# Patient Record
Sex: Male | Born: 1965
Health system: Southern US, Community
[De-identification: ages and names within clinical notes are randomized; demographics above are authoritative.]

## PROBLEM LIST (undated history)

## (undated) DIAGNOSIS — L659 Nonscarring hair loss, unspecified: Secondary | ICD-10-CM

## (undated) DIAGNOSIS — G4700 Insomnia, unspecified: Secondary | ICD-10-CM

## (undated) DIAGNOSIS — E785 Hyperlipidemia, unspecified: Secondary | ICD-10-CM

## (undated) DIAGNOSIS — F329 Major depressive disorder, single episode, unspecified: Secondary | ICD-10-CM

## (undated) HISTORY — PX: VASECTOMY: SHX75

## (undated) HISTORY — DX: Insomnia, unspecified: G47.00

## (undated) HISTORY — DX: Nonscarring hair loss, unspecified: L65.9

## (undated) HISTORY — DX: Major depressive disorder, single episode, unspecified: F32.9

## (undated) HISTORY — DX: Hyperlipidemia, unspecified: E78.5

## (undated) HISTORY — PX: ELBOW SURGERY: SHX618

## (undated) HISTORY — PX: WRIST SURGERY: SHX841

---

## 1998-05-04 ENCOUNTER — Encounter: Payer: Self-pay | Admitting: Emergency Medicine

## 1998-05-04 ENCOUNTER — Emergency Department (HOSPITAL_COMMUNITY): Admission: EM | Admit: 1998-05-04 | Discharge: 1998-05-04 | Payer: Self-pay | Admitting: Emergency Medicine

## 2005-12-24 ENCOUNTER — Ambulatory Visit: Payer: Self-pay | Admitting: Internal Medicine

## 2005-12-27 ENCOUNTER — Ambulatory Visit: Payer: Self-pay | Admitting: Internal Medicine

## 2007-10-23 ENCOUNTER — Ambulatory Visit: Payer: Self-pay | Admitting: Internal Medicine

## 2007-10-23 LAB — CONVERTED CEMR LAB
ALT: 19 units/L (ref 0–53)
AST: 31 units/L (ref 0–37)
Albumin: 4 g/dL (ref 3.5–5.2)
Alkaline Phosphatase: 68 units/L (ref 39–117)
BUN: 14 mg/dL (ref 6–23)
Basophils Absolute: 0 10*3/uL (ref 0.0–0.1)
Basophils Relative: 0.7 % (ref 0.0–1.0)
Bilirubin, Direct: 0.1 mg/dL (ref 0.0–0.3)
CO2: 29 meq/L (ref 19–32)
Calcium: 9.3 mg/dL (ref 8.4–10.5)
Chloride: 107 meq/L (ref 96–112)
Cholesterol: 181 mg/dL (ref 0–200)
Creatinine, Ser: 0.9 mg/dL (ref 0.4–1.5)
Eosinophils Absolute: 0.1 10*3/uL (ref 0.0–0.7)
Eosinophils Relative: 2.6 % (ref 0.0–5.0)
GFR calc Af Amer: 118 mL/min
GFR calc non Af Amer: 98 mL/min
Glucose, Bld: 109 mg/dL — ABNORMAL HIGH (ref 70–99)
HCT: 44.8 % (ref 39.0–52.0)
HDL: 47.4 mg/dL (ref >39.0)
Hemoglobin, Urine: NEGATIVE
Hemoglobin: 15.2 g/dL (ref 13.0–17.0)
Ketones, ur: NEGATIVE mg/dL
LDL Cholesterol: 118 mg/dL — ABNORMAL HIGH (ref 0–99)
Leukocytes, UA: NEGATIVE
Lymphocytes Relative: 23.8 % (ref 12.0–46.0)
MCHC: 33.8 g/dL (ref 30.0–36.0)
MCV: 92.8 fL (ref 78.0–100.0)
Monocytes Absolute: 0.5 10*3/uL (ref 0.1–1.0)
Monocytes Relative: 10.6 % (ref 3.0–12.0)
Neutro Abs: 3.3 10*3/uL (ref 1.4–7.7)
Neutrophils Relative %: 62.3 % (ref 43.0–77.0)
Nitrite: NEGATIVE
PSA: 0.38 ng/mL (ref 0.10–4.00)
Platelets: 295 10*3/uL (ref 150–400)
Potassium: 4.2 meq/L (ref 3.5–5.1)
RBC: 4.83 M/uL (ref 4.22–5.81)
RDW: 11.7 % (ref 11.5–14.6)
Sodium: 142 meq/L (ref 135–145)
Specific Gravity, Urine: >=1.03 (ref 1.000–1.03)
TSH: 0.66 microintl units/mL (ref 0.35–5.50)
Total Bilirubin: 0.7 mg/dL (ref 0.3–1.2)
Total CHOL/HDL Ratio: 3.8
Total Protein: 6.7 g/dL (ref 6.0–8.3)
Triglycerides: 80 mg/dL (ref 0–149)
Urine Glucose: NEGATIVE mg/dL
Urobilinogen, UA: 0.2 (ref 0.0–1.0)
VLDL: 16 mg/dL (ref 0–40)
WBC: 5.1 10*3/uL (ref 4.5–10.5)
pH: 6 (ref 5.0–8.0)

## 2007-10-26 ENCOUNTER — Ambulatory Visit: Payer: Self-pay | Admitting: Internal Medicine

## 2007-10-26 DIAGNOSIS — E785 Hyperlipidemia, unspecified: Secondary | ICD-10-CM | POA: Insufficient documentation

## 2007-10-26 HISTORY — DX: Hyperlipidemia, unspecified: E78.5

## 2007-12-05 ENCOUNTER — Telehealth (INDEPENDENT_AMBULATORY_CARE_PROVIDER_SITE_OTHER): Payer: Self-pay | Admitting: *Deleted

## 2007-12-05 DIAGNOSIS — F329 Major depressive disorder, single episode, unspecified: Secondary | ICD-10-CM

## 2007-12-05 DIAGNOSIS — F3289 Other specified depressive episodes: Secondary | ICD-10-CM

## 2007-12-05 HISTORY — DX: Other specified depressive episodes: F32.89

## 2007-12-05 HISTORY — DX: Major depressive disorder, single episode, unspecified: F32.9

## 2007-12-11 ENCOUNTER — Ambulatory Visit: Payer: Self-pay | Admitting: Professional

## 2010-03-19 DIAGNOSIS — J309 Allergic rhinitis, unspecified: Secondary | ICD-10-CM | POA: Insufficient documentation

## 2010-06-18 ENCOUNTER — Ambulatory Visit: Payer: Self-pay | Admitting: Internal Medicine

## 2010-06-18 ENCOUNTER — Encounter: Payer: Self-pay | Admitting: Internal Medicine

## 2010-06-18 DIAGNOSIS — G4700 Insomnia, unspecified: Secondary | ICD-10-CM

## 2010-06-18 HISTORY — DX: Insomnia, unspecified: G47.00

## 2010-06-19 LAB — CONVERTED CEMR LAB
ALT: 23 units/L (ref 0–53)
AST: 38 units/L — ABNORMAL HIGH (ref 0–37)
Albumin: 4.3 g/dL (ref 3.5–5.2)
Alkaline Phosphatase: 61 units/L (ref 39–117)
BUN: 20 mg/dL (ref 6–23)
Basophils Absolute: 0.1 10*3/uL (ref 0.0–0.1)
Basophils Relative: 1 % (ref 0.0–3.0)
Bilirubin Urine: NEGATIVE
Bilirubin, Direct: 0.1 mg/dL (ref 0.0–0.3)
Blood, UA: NEGATIVE
CO2: 26 meq/L (ref 19–32)
Calcium: 9.5 mg/dL (ref 8.4–10.5)
Chloride: 105 meq/L (ref 96–112)
Cholesterol: 246 mg/dL — ABNORMAL HIGH (ref 0–200)
Creatinine, Ser: 1 mg/dL (ref 0.4–1.5)
Direct LDL: 176.6 mg/dL
Eosinophils Absolute: 0.2 10*3/uL (ref 0.0–0.7)
Eosinophils Relative: 2.1 % (ref 0.0–5.0)
GFR calc non Af Amer: 87.47 mL/min (ref >60.00)
Glucose, Bld: 85 mg/dL (ref 70–99)
HCT: 46 % (ref 39.0–52.0)
HDL: 62.5 mg/dL (ref >39.00)
Hemoglobin: 16 g/dL (ref 13.0–17.0)
Ketones, ur: NEGATIVE mg/dL
Leukocytes, UA: NEGATIVE
Lymphocytes Relative: 21.2 % (ref 12.0–46.0)
Lymphs Abs: 1.7 10*3/uL (ref 0.7–4.0)
MCHC: 34.8 g/dL (ref 30.0–36.0)
MCV: 91.9 fL (ref 78.0–100.0)
Monocytes Absolute: 0.6 10*3/uL (ref 0.1–1.0)
Monocytes Relative: 7.7 % (ref 3.0–12.0)
Neutro Abs: 5.3 10*3/uL (ref 1.4–7.7)
Neutrophils Relative %: 68 % (ref 43.0–77.0)
Nitrite: NEGATIVE
PSA: 1.07 ng/mL (ref 0.10–4.00)
Platelets: 294 10*3/uL (ref 150.0–400.0)
Potassium: 4.5 meq/L (ref 3.5–5.1)
RBC: 5.01 M/uL (ref 4.22–5.81)
RDW: 12.5 % (ref 11.5–14.6)
Sodium: 139 meq/L (ref 135–145)
Specific Gravity, Urine: 1.025 (ref 1.000–1.030)
TSH: 1.02 microintl units/mL (ref 0.35–5.50)
Total Bilirubin: 0.7 mg/dL (ref 0.3–1.2)
Total CHOL/HDL Ratio: 4
Total Protein, Urine: NEGATIVE mg/dL
Total Protein: 7.4 g/dL (ref 6.0–8.3)
Triglycerides: 61 mg/dL (ref 0.0–149.0)
Urine Glucose: NEGATIVE mg/dL
Urobilinogen, UA: 0.2 (ref 0.0–1.0)
VLDL: 12.2 mg/dL (ref 0.0–40.0)
WBC: 7.9 10*3/uL (ref 4.5–10.5)
pH: 6 (ref 5.0–8.0)

## 2010-07-30 NOTE — Assessment & Plan Note (Signed)
Summary: OV---STC   Vital Signs:  Patient profile:   45 year old male Height:      70 inches Weight:      195 pounds BMI:     28.08 O2 Sat:      95 % on Room air Temp:     97.9 degrees F oral Pulse rate:   72 / minute BP sitting:   130 / 86  (left arm) Cuff size:   regular  Vitals Entered By: Zella Ball Ewing CMA (AAMA) (June 18, 2010 8:20 AM)  O2 Flow:  Room air  CC: physical/RE   CC:  physical/RE.  History of Present Illness: here for wellness, overall doing ok, Pt denies CP, worsening sob, doe, wheezing, orthopnea, pnd, worsening LE edema, palps, dizziness or syncope  Pt denies new neuro symptoms such as headache, facial or extremity weakness  Pt denies polydipsia, polyuria   Overall good compliance with meds, trying to follow low chol diet, wt stable, little excercise however No fever, wt loss, night sweats, loss of appetite or other constitutional symptoms  Overall good compliance with meds, and good tolerability. Denies worsening depressive symptoms, suicidal ideation, or panic.   Pt states good ability with ADL's, low fall risk, home safety reviewed and adequate, no significant change in hearing or vision, trying to follow lower chol diet, and occasionally active only with regular excercise.  Does have signficant sleep many nights, tends to wake up 1am and cant get back to sleep for 1-2 hrs.    Preventive Screening-Counseling & Management      Drug Use:  no.    Problems Prior to Update: 1)  Insomnia-sleep Disorder-unspec  (ICD-780.52) 2)  Depressive Disorder Not Elsewhere Classified  (ICD-311) 3)  Hyperlipidemia  (ICD-272.4) 4)  Preventive Health Care  (ICD-V70.0)  Medications Prior to Update: 1)  Finasteride 5 Mg  Tabs (Finasteride) .Marland Kitchen.. 1 By Mouth Qd  Current Medications (verified): 1)  Finasteride 5 Mg  Tabs (Finasteride) .Marland Kitchen.. 1 By Mouth Once Daily 2)  Zolpidem Tartrate 10 Mg Tabs (Zolpidem Tartrate) .Marland Kitchen.. 1 By Mouth At Bedtime  As Needed  Allergies (verified): No  Known Drug Allergies  Past History:  Past Medical History: Last updated: 10/26/2007 Hyperlipidemia alopecia - male pattern  Past Surgical History: Last updated: 10/26/2007 Vasectomy  Family History: Last updated: 10/26/2007 heart disease - father cousin with melanoma grandmother with DM  Social History: Last updated: 06/18/2010 Married no children work - Product manager Never Smoked Alcohol use-yes Drug use-no  Risk Factors: Alcohol Use: 2 (10/26/2007)  Risk Factors: Smoking Status: never (10/26/2007)  Social History: Married no children work - Product manager Never Smoked Alcohol use-yes Drug use-no Drug Use:  no  Review of Systems  The patient denies anorexia, fever, vision loss, decreased hearing, hoarseness, chest pain, syncope, dyspnea on exertion, peripheral edema, prolonged cough, headaches, hemoptysis, abdominal pain, melena, hematochezia, severe indigestion/heartburn, hematuria, muscle weakness, suspicious skin lesions, transient blindness, difficulty walking, depression, unusual weight change, abnormal bleeding, enlarged lymph nodes, and angioedema.         all otherwise negative per pt -    Physical Exam  General:  Well-developed,well-nourished,in no acute distress; alert,appropriate and cooperative throughout examination Head:  Normocephalic and atraumatic without obvious abnormalities. No apparent alopecia or balding. Eyes:  No corneal or conjunctival inflammation noted. EOMI. Perrla. Ears:  R ear normal and L ear normal.   Nose:  no external deformity and no nasal discharge.   Mouth:  no gingival abnormalities and pharynx pink and  moist.   Neck:  supple and no masses.   Lungs:  Normal respiratory effort, chest expands symmetrically. Lungs are clear to auscultation, no crackles or wheezes. Heart:  Normal rate and regular rhythm. S1 and S2 normal without gallop, murmur, click, rub or other extra sounds. Abdomen:  Bowel sounds  positive,abdomen soft and non-tender without masses, organomegaly or hernias noted. Msk:  No deformity or scoliosis noted of thoracic or lumbar spine.   Extremities:  No clubbing, cyanosis, edema, or deformity noted with normal full range of motion of all joints.   Neurologic:  No cranial nerve deficits noted. Station and gait are normal. Plantar reflexes are down-going bilaterally. DTRs are symmetrical throughout. Sensory, motor and coordinative functions appear intact. Skin:  color normal and no rashes.   Psych:  not depressed appearing and slightly anxious.     Impression & Recommendations:  Problem # 1:  Preventive Health Care (ICD-V70.0) Overall doing well, age appropriate education and counseling updated, referral for preventive services and immunizations addressed, dietary counseling and smoking status adressed , most recent labs reviewed, ecg reviewed I have personally reviewed and have noted 1.The patient's medical and social history 2.Their use of alcohol, tobacco or illicit drugs 3.Their current medications and supplements 4. Functional ability including ADL's, fall risk, home safety risk, hearing & visual impairment  5.Diet and physical activities 6.Evidence for depression or mood disorders The patients weight, height, BMI  have been recorded in the chart I have made referrals, counseling and provided education to the patient based review of the above  Orders: EKG w/ Interpretation (93000) TLB-BMP (Basic Metabolic Panel-BMET) (80048-METABOL) TLB-CBC Platelet - w/Differential (85025-CBCD) TLB-Hepatic/Liver Function Pnl (80076-HEPATIC) TLB-Lipid Panel (80061-LIPID) TLB-PSA (Prostate Specific Antigen) (84153-PSA) TLB-TSH (Thyroid Stimulating Hormone) (84443-TSH) TLB-Udip ONLY (81003-UDIP)  Problem # 2:  INSOMNIA-SLEEP DISORDER-UNSPEC (ICD-780.52)  His updated medication list for this problem includes:    Zolpidem Tartrate 10 Mg Tabs (Zolpidem tartrate) .Marland Kitchen... 1 by mouth at  bedtime  as needed treat as above, f/u any worsening signs or symptoms   Complete Medication List: 1)  Finasteride 5 Mg Tabs (Finasteride) .Marland Kitchen.. 1 by mouth once daily 2)  Zolpidem Tartrate 10 Mg Tabs (Zolpidem tartrate) .Marland Kitchen.. 1 by mouth at bedtime  as needed  Patient Instructions: 1)  Your EKG was good today 2)  Please go to the Lab in the basement for your blood and/or urine tests today 3)  Please call the number on the [facility] for results of your testing 4)  Please take all new medications as prescribed 5)  Continue all previous medications as before this visit  6)  Please schedule a follow-up appointment in 1 year, or sooner if needed Prescriptions: ZOLPIDEM TARTRATE 10 MG TABS (ZOLPIDEM TARTRATE) 1 by mouth at bedtime  as needed  #90 x 1   Entered and Authorized by:   Corwin Levins MD   Signed by:   Corwin Levins MD on 06/18/2010   Method used:   Print then Give to Patient   RxID:   [id number] FINASTERIDE 5 MG  TABS (FINASTERIDE) 1 by mouth once daily  #90 x 3   Entered and Authorized by:   Corwin Levins MD   Signed by:   Corwin Levins MD on 06/18/2010   Method used:   Print then Give to Patient   RxID:   [id number]    Orders Added: 1)  EKG w/ Interpretation [93000] 2)  TLB-BMP (Basic Metabolic Panel-BMET) [80048-METABOL] 3)  TLB-CBC Platelet -  w/Differential [85025-CBCD] 4)  TLB-Hepatic/Liver Function Pnl [80076-HEPATIC] 5)  TLB-Lipid Panel [80061-LIPID] 6)  TLB-PSA (Prostate Specific Antigen) [84153-PSA] 7)  TLB-TSH (Thyroid Stimulating Hormone) [84443-TSH] 8)  TLB-Udip ONLY [81003-UDIP] 9)  Est. Patient 40-64 years [54098]

## 2012-03-29 ENCOUNTER — Telehealth: Payer: Self-pay

## 2012-03-29 DIAGNOSIS — Z0000 Encounter for general adult medical examination without abnormal findings: Secondary | ICD-10-CM

## 2012-03-29 NOTE — Telephone Encounter (Signed)
Lab order entered.

## 2012-05-09 ENCOUNTER — Other Ambulatory Visit (INDEPENDENT_AMBULATORY_CARE_PROVIDER_SITE_OTHER): Payer: Self-pay

## 2012-05-09 DIAGNOSIS — Z0000 Encounter for general adult medical examination without abnormal findings: Secondary | ICD-10-CM

## 2012-05-09 LAB — URINALYSIS, ROUTINE W REFLEX MICROSCOPIC
Ketones, ur: NEGATIVE
Total Protein, Urine: NEGATIVE
Urobilinogen, UA: 0.2 (ref 0.0–1.0)
pH: 5.5 (ref 5.0–8.0)

## 2012-05-09 LAB — HEPATIC FUNCTION PANEL
Bilirubin, Direct: 0.1 mg/dL (ref 0.0–0.3)
Total Bilirubin: 0.9 mg/dL (ref 0.3–1.2)
Total Protein: 7.4 g/dL (ref 6.0–8.3)

## 2012-05-09 LAB — BASIC METABOLIC PANEL
BUN: 19 mg/dL (ref 6–23)
Calcium: 9.5 mg/dL (ref 8.4–10.5)
Creatinine, Ser: 0.9 mg/dL (ref 0.4–1.5)

## 2012-05-09 LAB — CBC WITH DIFFERENTIAL/PLATELET
Basophils Absolute: 0 10*3/uL (ref 0.0–0.1)
Basophils Relative: 0.5 % (ref 0.0–3.0)
Eosinophils Absolute: 0.1 10*3/uL (ref 0.0–0.7)
HCT: 45.4 % (ref 39.0–52.0)
Hemoglobin: 15.1 g/dL (ref 13.0–17.0)
Lymphs Abs: 1.7 10*3/uL (ref 0.7–4.0)
MCHC: 33.2 g/dL (ref 30.0–36.0)
MCV: 92.2 fl (ref 78.0–100.0)
Neutro Abs: 3.2 10*3/uL (ref 1.4–7.7)
RDW: 12.8 % (ref 11.5–14.6)

## 2012-05-09 LAB — TSH: TSH: 0.82 u[IU]/mL (ref 0.35–5.50)

## 2012-05-09 LAB — LIPID PANEL
Cholesterol: 237 mg/dL — ABNORMAL HIGH (ref 0–200)
Total CHOL/HDL Ratio: 4
Triglycerides: 53 mg/dL (ref 0.0–149.0)
VLDL: 10.6 mg/dL (ref 0.0–40.0)

## 2012-05-15 ENCOUNTER — Ambulatory Visit (INDEPENDENT_AMBULATORY_CARE_PROVIDER_SITE_OTHER): Payer: BC Managed Care – PPO | Admitting: Internal Medicine

## 2012-05-15 ENCOUNTER — Encounter: Payer: Self-pay | Admitting: Internal Medicine

## 2012-05-15 VITALS — BP 110/78 | HR 53 | Temp 96.6°F | Ht 70.0 in | Wt 204.2 lb

## 2012-05-15 DIAGNOSIS — Z23 Encounter for immunization: Secondary | ICD-10-CM

## 2012-05-15 DIAGNOSIS — G4700 Insomnia, unspecified: Secondary | ICD-10-CM

## 2012-05-15 DIAGNOSIS — Z0000 Encounter for general adult medical examination without abnormal findings: Secondary | ICD-10-CM | POA: Insufficient documentation

## 2012-05-15 DIAGNOSIS — L659 Nonscarring hair loss, unspecified: Secondary | ICD-10-CM

## 2012-05-15 DIAGNOSIS — E785 Hyperlipidemia, unspecified: Secondary | ICD-10-CM

## 2012-05-15 HISTORY — DX: Nonscarring hair loss, unspecified: L65.9

## 2012-05-15 MED ORDER — ZOLPIDEM TARTRATE 10 MG PO TABS: 10.0000 mg | ORAL_TABLET | Freq: Every evening | ORAL | Status: DC | PRN

## 2012-05-15 MED ORDER — FINASTERIDE 5 MG PO TABS: ORAL_TABLET | ORAL | Status: DC

## 2012-05-15 MED ORDER — ASPIRIN 81 MG PO TBEC: 81.0000 mg | DELAYED_RELEASE_TABLET | Freq: Every day | ORAL | Status: AC

## 2012-05-15 NOTE — Progress Notes (Signed)
Subjective:    Patient ID: Travis Kane, male    DOB: 05-26-1966, 46 y.o.   MRN: [id number]  HPI  Here for wellness and f/u;  Overall doing ok;  Pt denies CP, worsening SOB, DOE, wheezing, orthopnea, PND, worsening LE edema, palpitations, dizziness or syncope.  Pt denies neurological change such as new Headache, facial or extremity weakness.  Pt denies polydipsia, polyuria, or low sugar symptoms. Pt states overall good compliance with treatment and medications, good tolerability, and trying to follow lower cholesterol diet.  Pt denies worsening depressive symptoms, suicidal ideation or panic. No fever, wt loss, night sweats, loss of appetite, or other constitutional symptoms.  Pt states good ability with ADL's, low fall risk, home safety reviewed and adequate, no significant changes in hearing or vision, and occasionally active with exercise.  Works as Marine scientist, working for himself now. Past Medical History  Diagnosis Date  . Alopecia 05/15/2012  . INSOMNIA-SLEEP DISORDER-UNSPEC 06/18/2010    Qualifier: Diagnosis of  By: Jonny Ruiz MD, Len Blalock   . HYPERLIPIDEMIA 10/26/2007    Qualifier: Diagnosis of  By: Jonny Ruiz MD, Len Blalock   . DEPRESSIVE DISORDER NOT ELSEWHERE CLASSIFIED 12/05/2007    Qualifier: Diagnosis of  By: Jonny Ruiz MD, Len Blalock    Past Surgical History  Procedure Date  . Vasectomy     reports that he has never smoked. He has never used smokeless tobacco. He reports that he drinks alcohol. He reports that he does not use illicit drugs. family history includes Diabetes in his maternal grandmother and Heart disease in his father. No Known Allergies Current Outpatient Prescriptions on File Prior to Visit  Medication Sig Dispense Refill  . zolpidem (AMBIEN) 10 MG tablet Take 1 tablet (10 mg total) by mouth at bedtime as needed for sleep.  30 tablet  5   Review of Systems Review of Systems  Constitutional: Negative for diaphoresis, activity change, appetite change and  unexpected weight change.  HENT: Negative for hearing loss, ear pain, facial swelling, mouth sores and neck stiffness.   Eyes: Negative for pain, redness and visual disturbance.  Respiratory: Negative for shortness of breath and wheezing.   Cardiovascular: Negative for chest pain and palpitations.  Gastrointestinal: Negative for diarrhea, blood in stool, abdominal distention and rectal pain.  Genitourinary: Negative for hematuria, flank pain and decreased urine volume.  Musculoskeletal: Negative for myalgias and joint swelling.  Skin: Negative for color change and wound.  Neurological: Negative for syncope and numbness.  Hematological: Negative for adenopathy.  Psychiatric/Behavioral: Negative for hallucinations, self-injury, decreased concentration and agitation.      Objective:   Physical Exam BP 110/78  Pulse 53  Temp 96.6 F (35.9 C) (Oral)  Ht 5\' 10"  (1.778 m)  Wt 204 lb 4 oz (92.647 kg)  BMI 29.31 kg/m2  SpO2 94% Physical Exam  VS noted Constitutional: Pt is oriented to person, place, and time. Appears well-developed and well-nourished.  HENT:  Head: Normocephalic and atraumatic.  Right Ear: External ear normal.  Left Ear: External ear normal.  Nose: Nose normal.  Mouth/Throat: Oropharynx is clear and moist.  Eyes: Conjunctivae and EOM are normal. Pupils are equal, round, and reactive to light.  Neck: Normal range of motion. Neck supple. No JVD present. No tracheal deviation present.  Cardiovascular: Normal rate, regular rhythm, normal heart sounds and intact distal pulses.   Pulmonary/Chest: Effort normal and breath sounds normal.  Abdominal: Soft. Bowel sounds are normal. There is no tenderness.  Musculoskeletal: Normal range of  motion. Exhibits no edema.  Lymphadenopathy:  Has no cervical adenopathy.  Neurological: Pt is alert and oriented to person, place, and time. Pt has normal reflexes. No cranial nerve deficit.  Skin: Skin is warm and dry. No rash noted.    Psychiatric:  Has  normal mood and affect. Behavior is normal.     Assessment & Plan:

## 2012-05-15 NOTE — Patient Instructions (Addendum)
You had the tetanus shot today Your finasteride was refilled today Take all new medications as prescribed - the generic ambien as needed Please also start Aspirin at 81 mg per day - COATED only Please continue your efforts at being more active, low cholesterol diet, and weight control. Please call if you would like to start Lipitor at 10 mg per day Please return in 6 mo with Lab testing Only Thank you for enrolling in MyChart. Please follow the instructions below to securely access your online medical record. MyChart allows you to send messages to your doctor, view your test results, renew your prescriptions, schedule appointments, and more.  To Log into MyChart, please go to [email or web address], and your Username is: [email or web address] Please return in 1 year for your yearly visit, or sooner if needed, with Lab testing done 3-5 days before

## 2012-05-15 NOTE — Assessment & Plan Note (Signed)
To cont finasteride

## 2012-05-15 NOTE — Assessment & Plan Note (Signed)
D/w  Pt, declines statin for now, to f/u lipids at 6 mo

## 2012-05-15 NOTE — Assessment & Plan Note (Signed)
Continue all other medications as before  

## 2012-05-15 NOTE — Assessment & Plan Note (Signed)

## 2012-08-12 ENCOUNTER — Other Ambulatory Visit: Payer: Self-pay

## 2013-05-03 ENCOUNTER — Other Ambulatory Visit: Payer: Self-pay

## 2013-05-30 ENCOUNTER — Encounter: Payer: BC Managed Care – PPO | Admitting: Internal Medicine

## 2013-05-30 DIAGNOSIS — Z0289 Encounter for other administrative examinations: Secondary | ICD-10-CM

## 2014-05-28 ENCOUNTER — Telehealth: Payer: Self-pay

## 2014-05-28 DIAGNOSIS — Z0000 Encounter for general adult medical examination without abnormal findings: Secondary | ICD-10-CM

## 2014-05-28 NOTE — Telephone Encounter (Signed)
Labs entered.

## 2014-05-29 ENCOUNTER — Encounter: Payer: Self-pay | Admitting: Internal Medicine

## 2014-05-29 ENCOUNTER — Ambulatory Visit (INDEPENDENT_AMBULATORY_CARE_PROVIDER_SITE_OTHER): Payer: BC Managed Care – PPO | Admitting: Internal Medicine

## 2014-05-29 ENCOUNTER — Other Ambulatory Visit (INDEPENDENT_AMBULATORY_CARE_PROVIDER_SITE_OTHER): Payer: BC Managed Care – PPO

## 2014-05-29 ENCOUNTER — Other Ambulatory Visit: Payer: Self-pay | Admitting: Internal Medicine

## 2014-05-29 VITALS — BP 126/84 | HR 80 | Temp 97.8°F | Ht 70.0 in | Wt 208.0 lb

## 2014-05-29 DIAGNOSIS — G4700 Insomnia, unspecified: Secondary | ICD-10-CM

## 2014-05-29 DIAGNOSIS — E785 Hyperlipidemia, unspecified: Secondary | ICD-10-CM

## 2014-05-29 DIAGNOSIS — Z23 Encounter for immunization: Secondary | ICD-10-CM

## 2014-05-29 DIAGNOSIS — Z0000 Encounter for general adult medical examination without abnormal findings: Secondary | ICD-10-CM

## 2014-05-29 LAB — HEPATIC FUNCTION PANEL
ALT: 22 U/L (ref 0–53)
AST: 32 U/L (ref 0–37)
Albumin: 4.1 g/dL (ref 3.5–5.2)
Alkaline Phosphatase: 71 U/L (ref 39–117)
Bilirubin, Direct: 0.1 mg/dL (ref 0.0–0.3)
TOTAL PROTEIN: 7.1 g/dL (ref 6.0–8.3)
Total Bilirubin: 0.8 mg/dL (ref 0.2–1.2)

## 2014-05-29 LAB — CBC WITH DIFFERENTIAL/PLATELET
BASOS ABS: 0.1 10*3/uL (ref 0.0–0.1)
Basophils Relative: 1.4 % (ref 0.0–3.0)
Eosinophils Absolute: 0.4 10*3/uL (ref 0.0–0.7)
Eosinophils Relative: 6 % — ABNORMAL HIGH (ref 0.0–5.0)
HCT: 46.5 % (ref 39.0–52.0)
Hemoglobin: 15.3 g/dL (ref 13.0–17.0)
Lymphocytes Relative: 34.9 % (ref 12.0–46.0)
Lymphs Abs: 2.1 10*3/uL (ref 0.7–4.0)
MCHC: 33 g/dL (ref 30.0–36.0)
MCV: 91.2 fl (ref 78.0–100.0)
MONOS PCT: 9 % (ref 3.0–12.0)
Monocytes Absolute: 0.5 10*3/uL (ref 0.1–1.0)
NEUTROS PCT: 48.7 % (ref 43.0–77.0)
Neutro Abs: 2.9 10*3/uL (ref 1.4–7.7)
PLATELETS: 303 10*3/uL (ref 150.0–400.0)
RBC: 5.09 Mil/uL (ref 4.22–5.81)
RDW: 12.4 % (ref 11.5–15.5)
WBC: 5.9 10*3/uL (ref 4.0–10.5)

## 2014-05-29 LAB — LIPID PANEL
CHOLESTEROL: 262 mg/dL — AB (ref 0–200)
HDL: 42.9 mg/dL (ref >39.00)
LDL CALC: 198 mg/dL — AB (ref 0–99)
NonHDL: 219.1
Total CHOL/HDL Ratio: 6
Triglycerides: 107 mg/dL (ref 0.0–149.0)
VLDL: 21.4 mg/dL (ref 0.0–40.0)

## 2014-05-29 LAB — TSH: TSH: 1.49 u[IU]/mL (ref 0.35–4.50)

## 2014-05-29 LAB — URINALYSIS, ROUTINE W REFLEX MICROSCOPIC
Bilirubin Urine: NEGATIVE
HGB URINE DIPSTICK: NEGATIVE
Ketones, ur: NEGATIVE
Leukocytes, UA: NEGATIVE
NITRITE: NEGATIVE
RBC / HPF: NONE SEEN (ref 0–?)
SPECIFIC GRAVITY, URINE: 1.025 (ref 1.000–1.030)
TOTAL PROTEIN, URINE-UPE24: NEGATIVE
UROBILINOGEN UA: 0.2 (ref 0.0–1.0)
Urine Glucose: NEGATIVE
pH: 6 (ref 5.0–8.0)

## 2014-05-29 LAB — BASIC METABOLIC PANEL
BUN: 18 mg/dL (ref 6–23)
CALCIUM: 9.3 mg/dL (ref 8.4–10.5)
CO2: 26 mEq/L (ref 19–32)
Chloride: 104 mEq/L (ref 96–112)
Creatinine, Ser: 1 mg/dL (ref 0.4–1.5)
GFR: 87.05 mL/min (ref >60.00)
Glucose, Bld: 91 mg/dL (ref 70–99)
Potassium: 4.8 mEq/L (ref 3.5–5.1)
SODIUM: 137 meq/L (ref 135–145)

## 2014-05-29 LAB — PSA: PSA: 1.18 ng/mL (ref 0.10–4.00)

## 2014-05-29 MED ORDER — FINASTERIDE 5 MG PO TABS: ORAL_TABLET | ORAL | Status: AC

## 2014-05-29 MED ORDER — ATORVASTATIN CALCIUM 20 MG PO TABS: 20.0000 mg | ORAL_TABLET | Freq: Every day | ORAL | Status: AC

## 2014-05-29 MED ORDER — ZOLPIDEM TARTRATE 10 MG PO TABS: 10.0000 mg | ORAL_TABLET | Freq: Every evening | ORAL | Status: AC | PRN

## 2014-05-29 NOTE — Assessment & Plan Note (Signed)
Stable, cont ambien prn

## 2014-05-29 NOTE — Addendum Note (Signed)
Addended by: Biagio Borg on: 05/29/2014 10:48 AM   Modules accepted: Miquel Dunn

## 2014-05-29 NOTE — Progress Notes (Signed)
Subjective:    Patient ID: Travis Kane, male    DOB: 1965/09/10, 48 y.o.   MRN: [id number]  HPI Here for wellness and f/u;  Overall doing ok;  Pt denies CP, worsening SOB, DOE, wheezing, orthopnea, PND, worsening LE edema, palpitations, dizziness or syncope.  Pt denies neurological change such as new headache, facial or extremity weakness.  Pt denies polydipsia, polyuria, or low sugar symptoms. Pt states overall good compliance with treatment and medications, good tolerability, and has been trying to follow lower cholesterol diet.  Pt denies worsening depressive symptoms, suicidal ideation or panic. No fever, night sweats, wt loss, loss of appetite, or other constitutional symptoms.  Pt states good ability with ADL's, has low fall risk, home safety reviewed and adequate, no other significant changes in hearing or vision, and only occasionally active with exercise. Past Medical History  Diagnosis Date  . Alopecia 05/15/2012  . INSOMNIA-SLEEP DISORDER-UNSPEC 06/18/2010    Qualifier: Diagnosis of  By: Jenny Reichmann MD, Hunt Oris   . HYPERLIPIDEMIA 10/26/2007    Qualifier: Diagnosis of  By: Jenny Reichmann MD, Hunt Oris   . DEPRESSIVE DISORDER NOT ELSEWHERE CLASSIFIED 12/05/2007    Qualifier: Diagnosis of  By: Jenny Reichmann MD, Hunt Oris    Past Surgical History  Procedure Laterality Date  . Vasectomy      reports that he has never smoked. He has never used smokeless tobacco. He reports that he drinks alcohol. He reports that he does not use illicit drugs. family history includes Diabetes in his maternal grandmother; Heart attack in his brother; Heart disease in his father. No Known Allergies Current Outpatient Prescriptions on File Prior to Visit  Medication Sig Dispense Refill  . aspirin 81 MG EC tablet Take 1 tablet (81 mg total) by mouth daily. Swallow whole. 30 tablet 12  . finasteride (PROSCAR) 5 MG tablet 1/2 tab by mouth per day 45 tablet 3  . zolpidem (AMBIEN) 10 MG tablet Take 1 tablet (10 mg total) by mouth at  bedtime as needed for sleep. 30 tablet 5   No current facility-administered medications on file prior to visit.    44yo brother died with MI. Pt now back to taking asa 81 mg after this, and willing to start statin if LDL elevated.    Review of Systems Constitutional: Negative for increased diaphoresis, other activity, appetite or other siginficant weight change  HENT: Negative for worsening hearing loss, ear pain, facial swelling, mouth sores and neck stiffness.   Eyes: Negative for other worsening pain, redness or visual disturbance.  Respiratory: Negative for shortness of breath and wheezing.   Cardiovascular: Negative for chest pain and palpitations.  Gastrointestinal: Negative for diarrhea, blood in stool, abdominal distention or other pain Genitourinary: Negative for hematuria, flank pain or change in urine volume.  Musculoskeletal: Negative for myalgias or other joint complaints.  Skin: Negative for color change and wound.  Neurological: Negative for syncope and numbness. other than noted Hematological: Negative for adenopathy. or other swelling Psychiatric/Behavioral: Negative for hallucinations, self-injury, decreased concentration or other worsening agitation.      Objective:   Physical Exam BP 126/84 mmHg  Pulse 80  Temp(Src) 97.8 F (36.6 C) (Oral)  Ht 5\' 10"  (1.778 m)  Wt 208 lb (94.348 kg)  BMI 29.84 kg/m2  SpO2 97% VS noted,  Constitutional: Pt is oriented to person, place, and time. Appears well-developed and well-nourished.  Head: Normocephalic and atraumatic.  Right Ear: External ear normal.  Left Ear: External ear normal.  Nose: Nose  normal.  Mouth/Throat: Oropharynx is clear and moist.  Eyes: Conjunctivae and EOM are normal. Pupils are equal, round, and reactive to light.  Neck: Normal range of motion. Neck supple. No JVD present. No tracheal deviation present.  Cardiovascular: Normal rate, regular rhythm, normal heart sounds and intact distal pulses.     Pulmonary/Chest: Effort normal and breath sounds without rales or wheezing  Abdominal: Soft. Bowel sounds are normal. NT. No HSM  Musculoskeletal: Normal range of motion. Exhibits no edema.  Lymphadenopathy:  Has no cervical adenopathy.  Neurological: Pt is alert and oriented to person, place, and time. Pt has normal reflexes. No cranial nerve deficit. Motor grossly intact Skin: Skin is warm and dry. No rash noted.  Psychiatric:  Has normal mood and affect. Behavior is normal.     Assessment & Plan:

## 2014-05-29 NOTE — Addendum Note (Signed)
Addended by: Sharon Seller B on: 05/29/2014 10:40 AM   Modules accepted: Orders

## 2014-05-29 NOTE — Patient Instructions (Addendum)
You had the flu shot today  Your EKG was OK today  Please re-start the Aspirin 81 mg per day (enteric coated only)  You will be contacted regarding the referral for: colonoscopy  Your blood work was done and pendfing from this AM.   Please continue all other medications as before, and refills have been done if requested.  Please have the pharmacy call with any other refills you may need.  Please continue your efforts at being more active, low cholesterol diet, and weight control.  You are otherwise up to date with prevention measures today.  Please keep your appointments with your specialists as you may have planned  You will be contacted by phone if any changes need to be made immediately.  Otherwise, you will receive a letter about your results with an explanation, but please check with MyChart first.  Please remember to sign up for MyChart if you have not done so, as this will be important to you in the future with finding out test results, communicating by private email, and scheduling acute appointments online when needed.  Please return in 1 year for your yearly visit, or sooner if needed, with Lab testing done 3-5 days before

## 2014-05-29 NOTE — Progress Notes (Signed)
Pre visit review using our clinic review tool, if applicable. No additional management support is needed unless otherwise documented below in the visit note. 

## 2014-05-29 NOTE — Assessment & Plan Note (Addendum)

## 2014-05-29 NOTE — Assessment & Plan Note (Signed)
lkely needs statin due to [place], will check LDL

## 2015-09-17 DIAGNOSIS — D126 Benign neoplasm of colon, unspecified: Secondary | ICD-10-CM | POA: Insufficient documentation

## 2016-05-14 DIAGNOSIS — E669 Obesity, unspecified: Secondary | ICD-10-CM | POA: Insufficient documentation

## 2016-05-14 DIAGNOSIS — N529 Male erectile dysfunction, unspecified: Secondary | ICD-10-CM | POA: Insufficient documentation

## 2018-05-18 DIAGNOSIS — R030 Elevated blood-pressure reading, without diagnosis of hypertension: Secondary | ICD-10-CM | POA: Insufficient documentation

## 2018-05-19 DIAGNOSIS — Z8249 Family history of ischemic heart disease and other diseases of the circulatory system: Secondary | ICD-10-CM | POA: Insufficient documentation

## 2018-06-26 DIAGNOSIS — M19029 Primary osteoarthritis, unspecified elbow: Secondary | ICD-10-CM | POA: Insufficient documentation

## 2018-06-26 DIAGNOSIS — M25531 Pain in right wrist: Secondary | ICD-10-CM | POA: Insufficient documentation

## 2018-08-04 DIAGNOSIS — Z8249 Family history of ischemic heart disease and other diseases of the circulatory system: Secondary | ICD-10-CM | POA: Insufficient documentation

## 2018-11-03 DIAGNOSIS — I10 Essential (primary) hypertension: Secondary | ICD-10-CM | POA: Insufficient documentation

## 2018-12-27 DIAGNOSIS — S63591A Other specified sprain of right wrist, initial encounter: Secondary | ICD-10-CM | POA: Insufficient documentation

## 2019-10-23 DIAGNOSIS — F902 Attention-deficit hyperactivity disorder, combined type: Secondary | ICD-10-CM | POA: Insufficient documentation

## 2020-12-28 DIAGNOSIS — U071 COVID-19: Secondary | ICD-10-CM | POA: Diagnosis not present

## 2021-01-01 DIAGNOSIS — F902 Attention-deficit hyperactivity disorder, combined type: Secondary | ICD-10-CM | POA: Diagnosis not present

## 2021-02-11 ENCOUNTER — Encounter: Payer: Self-pay | Admitting: Physician Assistant

## 2021-02-11 ENCOUNTER — Telehealth: Payer: 59 | Admitting: Physician Assistant

## 2021-02-11 DIAGNOSIS — M545 Low back pain, unspecified: Secondary | ICD-10-CM

## 2021-02-11 MED ORDER — METHYLPREDNISOLONE 4 MG PO TBPK
ORAL_TABLET | ORAL | 0 refills | Status: AC
Start: 2021-02-11 — End: ?

## 2021-02-11 MED ORDER — CYCLOBENZAPRINE HCL 10 MG PO TABS: 10.0000 mg | ORAL_TABLET | Freq: Three times a day (TID) | ORAL | 0 refills | Status: AC | PRN

## 2021-02-11 NOTE — Progress Notes (Signed)
Virtual Visit Consent   Travis Kane, you are scheduled for a virtual visit with a [facility] provider today.     Just as with appointments in the office, your consent must be obtained to participate.  Your consent will be active for this visit and any virtual visit you may have with one of our providers in the next 365 days.     If you have a MyChart account, a copy of this consent can be sent to you electronically.  All virtual visits are billed to your insurance company just like a traditional visit in the office.    As this is a virtual visit, video technology does not allow for your provider to perform a traditional examination.  This may limit your provider's ability to fully assess your condition.  If your provider identifies any concerns that need to be evaluated in person or the need to arrange testing (such as labs, EKG, etc.), we will make arrangements to do so.     Although advances in technology are sophisticated, we cannot ensure that it will always work on either your end or our end.  If the connection with a video visit is poor, the visit may have to be switched to a telephone visit.  With either a video or telephone visit, we are not always able to ensure that we have a secure connection.     I need to obtain your verbal consent now.   Are you willing to proceed with your visit today?    Travis Kane has provided verbal consent on 02/11/2021 for a virtual visit (video or telephone).   Mar Daring, PA-C   Date: 02/11/2021 9:40 AM   Virtual Visit via Video Note   I, Mar Daring, connected with  Travis Kane  ([id number], 10-17-1965) on 02/11/21 at  9:30 AM EDT by a video-enabled telemedicine application and verified that I am speaking with the correct person using two identifiers.  Location: Patient: Virtual Visit Location Patient: Home Provider: Virtual Visit Location Provider: Home Office   I discussed the limitations of evaluation and management by  telemedicine and the availability of in person appointments. The patient expressed understanding and agreed to proceed.    History of Present Illness: Travis Kane is a 55 y.o. who identifies as a male who was assigned male at birth, and is being seen today for acute back pain.  HPI: Back Pain This is a new problem. The current episode started in the past 7 days (saturday; doing lawn work, lifting mulch bags). The problem occurs constantly. The problem has been gradually worsening since onset. The pain is present in the lumbar spine. The quality of the pain is described as aching and stabbing. The pain does not radiate. The pain is moderate. The pain is The same all the time. The symptoms are aggravated by bending, lying down, sitting, twisting, standing and position. Stiffness is present All day. Pertinent negatives include no abdominal pain, bladder incontinence, bowel incontinence, leg pain, numbness or paresthesias. He has tried heat and home exercises (tylenol and naproxen together) for the symptoms. The treatment provided mild relief.     Problems:  Patient Active Problem List   Diagnosis Date Noted   Alopecia 05/15/2012   Preventative health care 05/15/2012   Insomnia 06/18/2010   DEPRESSIVE DISORDER NOT ELSEWHERE CLASSIFIED 12/05/2007   Hyperlipidemia 10/26/2007    Allergies: No Known Allergies Medications:  Current Outpatient Medications:    cyclobenzaprine (FLEXERIL) 10 MG  tablet, Take 1 tablet (10 mg total) by mouth 3 (three) times daily as needed for muscle spasms., Disp: 30 tablet, Rfl: 0   methylPREDNISolone (MEDROL) 4 MG TBPK tablet, 6 day taper; take as directed on package instructions, Disp: 21 tablet, Rfl: 0   aspirin 81 MG EC tablet, Take 1 tablet (81 mg total) by mouth daily. Swallow whole., Disp: 30 tablet, Rfl: 12   atorvastatin (LIPITOR) 20 MG tablet, Take 1 tablet (20 mg total) by mouth daily., Disp: 90 tablet, Rfl: 3   finasteride (PROSCAR) 5 MG tablet, 1/2 tab by  mouth per day, Disp: 45 tablet, Rfl: 3   zolpidem (AMBIEN) 10 MG tablet, Take 1 tablet (10 mg total) by mouth at bedtime as needed for sleep., Disp: 30 tablet, Rfl: 5  Observations/Objective: Patient is well-developed, well-nourished in no acute distress.  Resting comfortably at home.  Head is normocephalic, atraumatic.  No labored breathing.  Speech is clear and coherent with logical content.  Patient is alert and oriented at baseline.    Assessment and Plan: 1. Acute bilateral low back pain without sciatica - methylPREDNISolone (MEDROL) 4 MG TBPK tablet; 6 day taper; take as directed on package instructions  Dispense: 21 tablet; Refill: 0 - cyclobenzaprine (FLEXERIL) 10 MG tablet; Take 1 tablet (10 mg total) by mouth 3 (three) times daily as needed for muscle spasms.  Dispense: 30 tablet; Refill: 0  - Medrol dose pak and flexeril as above - Hold any other NSAIDs while on prednisone - Tylenol ok to continue - Continue heat - Exercises and stretches provided via AVS - Seek in person evaluation if symptoms fail to improve or worsen  Follow Up Instructions: I discussed the assessment and treatment plan with the patient. The patient was provided an opportunity to ask questions and all were answered. The patient agreed with the plan and demonstrated an understanding of the instructions.  A copy of instructions were sent to the patient via MyChart.  The patient was advised to call back or seek an in-person evaluation if the symptoms worsen or if the condition fails to improve as anticipated.  Time:  I spent 11 minutes with the patient via telehealth technology discussing the above problems/concerns.    Mar Daring, PA-C

## 2021-02-11 NOTE — Patient Instructions (Signed)
Back Exercises The following exercises strengthen the muscles that help to support the trunk and back. They also help to keep the lower back flexible. Doing these exercises can help to prevent back pain or lessen existing pain. If you have back pain or discomfort, try doing these exercises 2-3 times each day or as told by your health care provider. As your pain improves, do them once each day, but increase the number of times that you repeat the steps for each exercise (do more repetitions). To prevent the recurrence of back pain, continue to do these exercises once each day or as told by your health care provider. Do exercises exactly as told by your health care provider and adjust them as directed. It is normal to feel mild stretching, pulling, tightness, or discomfort as you do these exercises, but you should stop right away if youfeel sudden pain or your pain gets worse. Exercises Single knee to chest Repeat these steps 3-5 times for each leg: Lie on your back on a firm bed or the floor with your legs extended. Bring one knee to your chest. Your other leg should stay extended and in contact with the floor. Hold your knee in place by grabbing your knee or thigh with both hands and hold. Pull on your knee until you feel a gentle stretch in your lower back or buttocks. Hold the stretch for 10-30 seconds. Slowly release and straighten your leg. Pelvic tilt Repeat these steps 5-10 times: Lie on your back on a firm bed or the floor with your legs extended. Bend your knees so they are pointing toward the ceiling and your feet are flat on the floor. Tighten your lower abdominal muscles to press your lower back against the floor. This motion will tilt your pelvis so your tailbone points up toward the ceiling instead of pointing to your feet or the floor. With gentle tension and even breathing, hold this position for 5-10 seconds. Cat-cow Repeat these steps until your lower back becomes more  flexible: Get into a hands-and-knees position on a firm surface. Keep your hands under your shoulders, and keep your knees under your hips. You may place padding under your knees for comfort. Let your head hang down toward your chest. Contract your abdominal muscles and point your tailbone toward the floor so your lower back becomes rounded like the back of a cat. Hold this position for 5 seconds. Slowly lift your head, let your abdominal muscles relax and point your tailbone up toward the ceiling so your back forms a sagging arch like the back of a cow. Hold this position for 5 seconds.  Press-ups Repeat these steps 5-10 times: Lie on your abdomen (face-down) on the floor. Place your palms near your head, about shoulder-width apart. Keeping your back as relaxed as possible and keeping your hips on the floor, slowly straighten your arms to raise the top half of your body and lift your shoulders. Do not use your back muscles to raise your upper torso. You may adjust the placement of your hands to make yourself more comfortable. Hold this position for 5 seconds while you keep your back relaxed. Slowly return to lying flat on the floor.  Bridges Repeat these steps 10 times: Lie on your back on a firm surface. Bend your knees so they are pointing toward the ceiling and your feet are flat on the floor. Your arms should be flat at your sides, next to your body. Tighten your buttocks muscles and lift your   buttocks off the floor until your waist is at almost the same height as your knees. You should feel the muscles working in your buttocks and the back of your thighs. If you do not feel these muscles, slide your feet 1-2 inches farther away from your buttocks. Hold this position for 3-5 seconds. Slowly lower your hips to the starting position, and allow your buttocks muscles to relax completely. If this exercise is too easy, try doing it with your arms crossed over yourchest. Abdominal  crunches Repeat these steps 5-10 times: Lie on your back on a firm bed or the floor with your legs extended. Bend your knees so they are pointing toward the ceiling and your feet are flat on the floor. Cross your arms over your chest. Tip your chin slightly toward your chest without bending your neck. Tighten your abdominal muscles and slowly raise your trunk (torso) high enough to lift your shoulder blades a tiny bit off the floor. Avoid raising your torso higher than that because it can put too much stress on your low back and does not help to strengthen your abdominal muscles. Slowly return to your starting position. Back lifts Repeat these steps 5-10 times: Lie on your abdomen (face-down) with your arms at your sides, and rest your forehead on the floor. Tighten the muscles in your legs and your buttocks. Slowly lift your chest off the floor while you keep your hips pressed to the floor. Keep the back of your head in line with the curve in your back. Your eyes should be looking at the floor. Hold this position for 3-5 seconds. Slowly return to your starting position. Contact a health care provider if: Your back pain or discomfort gets much worse when you do an exercise. Your worsening back pain or discomfort does not lessen within 2 hours after you exercise. If you have any of these problems, stop doing these exercises right away. Do not do them again unless your health care provider says that you can. Get help right away if: You develop sudden, severe back pain. If this happens, stop doing the exercises right away. Do not do them again unless your health care provider says that you can. This information is not intended to replace advice given to you by your health care provider. Make sure you discuss any questions you have with your healthcare provider. Document Revised: 10/19/2018 Document Reviewed: 03/16/2018 Elsevier Patient Education  [place].

## 2021-03-26 DIAGNOSIS — F902 Attention-deficit hyperactivity disorder, combined type: Secondary | ICD-10-CM | POA: Diagnosis not present

## 2021-04-06 DIAGNOSIS — M5416 Radiculopathy, lumbar region: Secondary | ICD-10-CM | POA: Diagnosis not present

## 2021-04-15 ENCOUNTER — Other Ambulatory Visit (HOSPITAL_BASED_OUTPATIENT_CLINIC_OR_DEPARTMENT_OTHER): Payer: Self-pay | Admitting: Rehabilitation

## 2021-04-15 DIAGNOSIS — M5416 Radiculopathy, lumbar region: Secondary | ICD-10-CM

## 2021-04-25 ENCOUNTER — Other Ambulatory Visit: Payer: Self-pay

## 2021-04-25 ENCOUNTER — Ambulatory Visit (HOSPITAL_BASED_OUTPATIENT_CLINIC_OR_DEPARTMENT_OTHER)
Admission: RE | Admit: 2021-04-25 | Discharge: 2021-04-25 | Disposition: A | Discharge status: Home / Self Care | Payer: 59 | Source: Ambulatory Visit | Attending: Rehabilitation | Admitting: Rehabilitation

## 2021-04-25 DIAGNOSIS — M5416 Radiculopathy, lumbar region: Secondary | ICD-10-CM | POA: Insufficient documentation

## 2021-04-25 DIAGNOSIS — M545 Low back pain, unspecified: Secondary | ICD-10-CM | POA: Diagnosis not present

## 2021-05-11 DIAGNOSIS — M5416 Radiculopathy, lumbar region: Secondary | ICD-10-CM | POA: Diagnosis not present

## 2021-07-31 DIAGNOSIS — R5383 Other fatigue: Secondary | ICD-10-CM | POA: Diagnosis not present

## 2021-07-31 DIAGNOSIS — Z125 Encounter for screening for malignant neoplasm of prostate: Secondary | ICD-10-CM | POA: Diagnosis not present

## 2021-07-31 DIAGNOSIS — Z1211 Encounter for screening for malignant neoplasm of colon: Secondary | ICD-10-CM | POA: Diagnosis not present

## 2021-07-31 DIAGNOSIS — Z13220 Encounter for screening for lipoid disorders: Secondary | ICD-10-CM | POA: Diagnosis not present

## 2021-07-31 DIAGNOSIS — F909 Attention-deficit hyperactivity disorder, unspecified type: Secondary | ICD-10-CM | POA: Diagnosis not present

## 2021-07-31 DIAGNOSIS — Z130 Encounter for screening for diseases of the blood and blood-forming organs and certain disorders involving the immune mechanism: Secondary | ICD-10-CM | POA: Diagnosis not present

## 2021-07-31 DIAGNOSIS — Z0000 Encounter for general adult medical examination without abnormal findings: Secondary | ICD-10-CM | POA: Diagnosis not present

## 2021-07-31 DIAGNOSIS — Z131 Encounter for screening for diabetes mellitus: Secondary | ICD-10-CM | POA: Diagnosis not present

## 2021-09-11 DIAGNOSIS — F902 Attention-deficit hyperactivity disorder, combined type: Secondary | ICD-10-CM | POA: Diagnosis not present

## 2021-10-06 DIAGNOSIS — Z86010 Personal history of colonic polyps: Secondary | ICD-10-CM | POA: Diagnosis not present

## 2021-10-06 DIAGNOSIS — D123 Benign neoplasm of transverse colon: Secondary | ICD-10-CM | POA: Diagnosis not present

## 2021-10-06 DIAGNOSIS — Z1211 Encounter for screening for malignant neoplasm of colon: Secondary | ICD-10-CM | POA: Diagnosis not present

## 2021-10-06 DIAGNOSIS — Z09 Encounter for follow-up examination after completed treatment for conditions other than malignant neoplasm: Secondary | ICD-10-CM | POA: Diagnosis not present

## 2021-10-06 DIAGNOSIS — K648 Other hemorrhoids: Secondary | ICD-10-CM | POA: Diagnosis not present

## 2021-12-14 DIAGNOSIS — F902 Attention-deficit hyperactivity disorder, combined type: Secondary | ICD-10-CM | POA: Diagnosis not present

## 2022-02-18 DIAGNOSIS — R7303 Prediabetes: Secondary | ICD-10-CM | POA: Diagnosis not present

## 2022-02-18 DIAGNOSIS — E785 Hyperlipidemia, unspecified: Secondary | ICD-10-CM | POA: Diagnosis not present

## 2022-02-18 DIAGNOSIS — I10 Essential (primary) hypertension: Secondary | ICD-10-CM | POA: Diagnosis not present

## 2022-03-08 DIAGNOSIS — F902 Attention-deficit hyperactivity disorder, combined type: Secondary | ICD-10-CM | POA: Diagnosis not present

## 2022-05-31 DIAGNOSIS — F902 Attention-deficit hyperactivity disorder, combined type: Secondary | ICD-10-CM | POA: Diagnosis not present

## 2022-10-05 DIAGNOSIS — Z1331 Encounter for screening for depression: Secondary | ICD-10-CM | POA: Diagnosis not present

## 2022-10-05 DIAGNOSIS — N528 Other male erectile dysfunction: Secondary | ICD-10-CM | POA: Diagnosis not present

## 2022-10-26 DIAGNOSIS — Z125 Encounter for screening for malignant neoplasm of prostate: Secondary | ICD-10-CM | POA: Diagnosis not present

## 2022-10-26 DIAGNOSIS — N529 Male erectile dysfunction, unspecified: Secondary | ICD-10-CM | POA: Diagnosis not present

## 2022-10-26 DIAGNOSIS — Z0001 Encounter for general adult medical examination with abnormal findings: Secondary | ICD-10-CM | POA: Diagnosis not present

## 2022-10-26 DIAGNOSIS — I10 Essential (primary) hypertension: Secondary | ICD-10-CM | POA: Diagnosis not present

## 2022-10-26 DIAGNOSIS — E785 Hyperlipidemia, unspecified: Secondary | ICD-10-CM | POA: Diagnosis not present

## 2022-10-26 DIAGNOSIS — R7303 Prediabetes: Secondary | ICD-10-CM | POA: Diagnosis not present

## 2022-12-29 DIAGNOSIS — S0990XA Unspecified injury of head, initial encounter: Secondary | ICD-10-CM | POA: Diagnosis not present

## 2022-12-29 DIAGNOSIS — S0083XA Contusion of other part of head, initial encounter: Secondary | ICD-10-CM | POA: Diagnosis not present

## 2022-12-29 DIAGNOSIS — S0101XA Laceration without foreign body of scalp, initial encounter: Secondary | ICD-10-CM | POA: Diagnosis not present

## 2023-01-05 DIAGNOSIS — S0101XD Laceration without foreign body of scalp, subsequent encounter: Secondary | ICD-10-CM | POA: Diagnosis not present

## 2023-01-05 DIAGNOSIS — S0003XD Contusion of scalp, subsequent encounter: Secondary | ICD-10-CM | POA: Diagnosis not present

## 2023-01-31 DIAGNOSIS — F902 Attention-deficit hyperactivity disorder, combined type: Secondary | ICD-10-CM | POA: Diagnosis not present

## 2023-05-02 IMAGING — MR MR LUMBAR SPINE W/O CM
4 of 5 series · 26 of 48 positions shown · non-contrast
Comparison: None.

CLINICAL DATA: Low back pain and bilateral leg pain

EXAM:
MRI LUMBAR SPINE WITHOUT CONTRAST
TECHNIQUE: Multiplanar, multisequence MR imaging of the lumbar spine was
performed. No intravenous contrast was administered.

[Series 2: T1 · sagittal · 4.0mm · 0.81mm/px · 6 of 16 slices shown (1 of 2)]
[im 1/16]
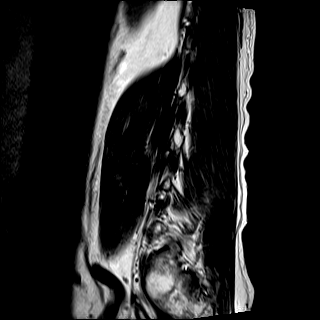
[im 4/16]
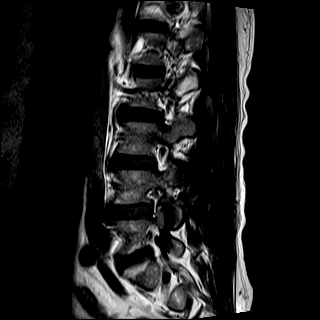
[im 7/16]
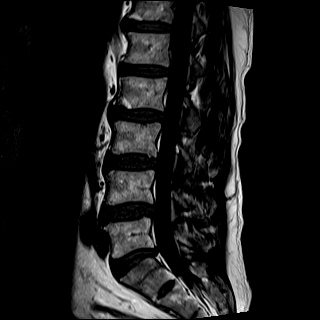
[im 10/16]
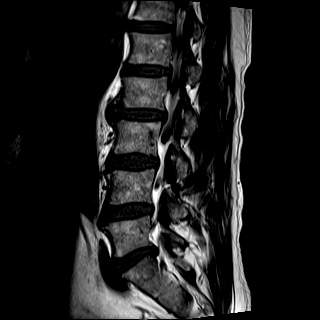
[im 13/16]
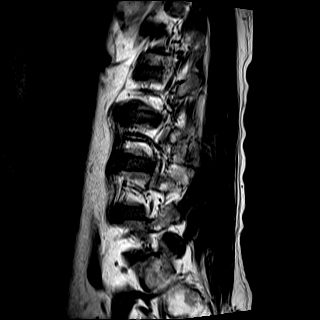
[im 16/16]
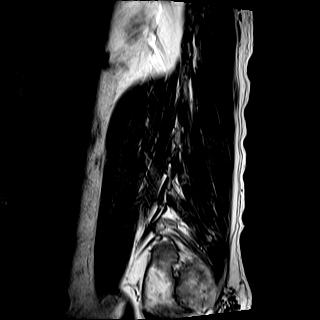

[Series 3: T2 · sagittal · 4.0mm · 0.81mm/px · 6 of 16 slices shown (1 of 2)]
[im 1/16]
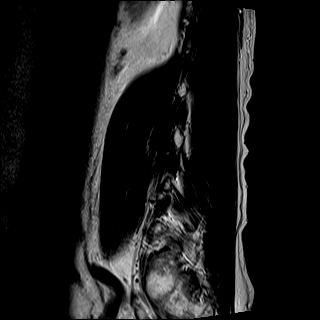
[im 4/16]
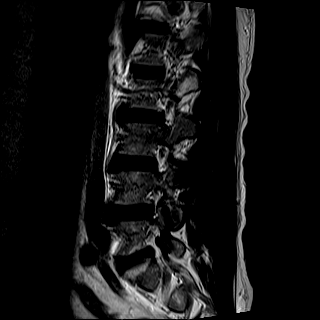
[im 7/16]
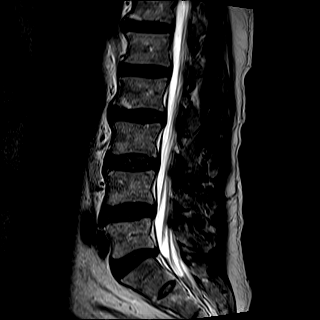
[im 10/16]
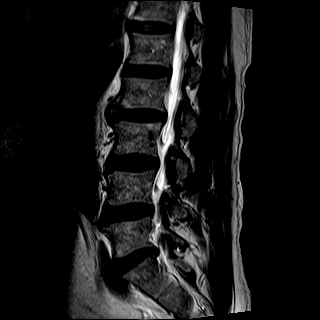
[im 13/16]
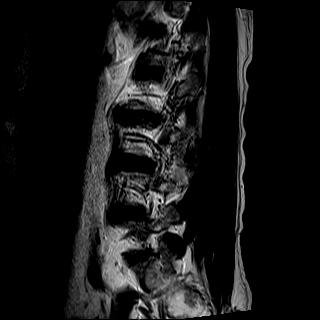
[im 16/16]
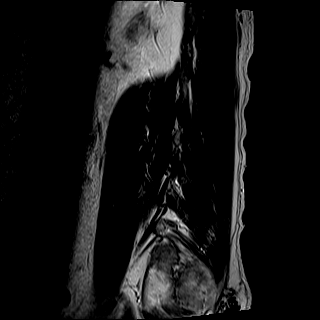

[Series 5: T2 · axial · 4.0mm · 0.39mm/px · z∈[-162,+33]mm · 9 of 40 slices shown (2 of 2)]
[im 1/40]
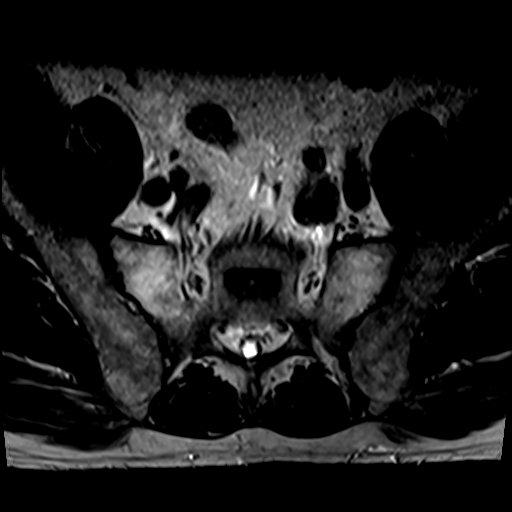
[im 6/40]
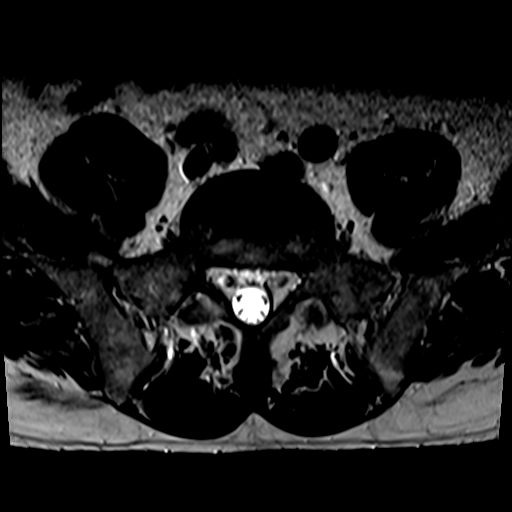
[im 12/40]
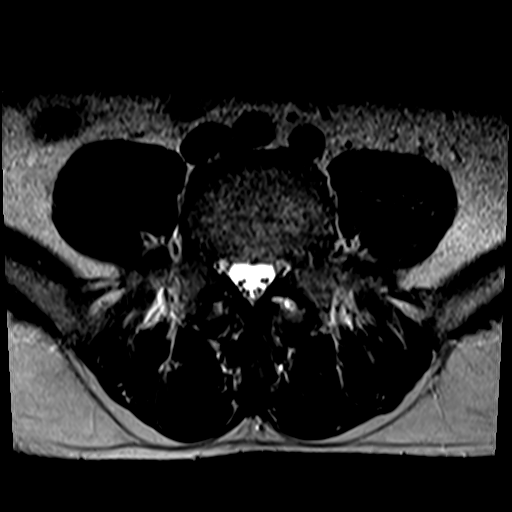
[im 17/40]
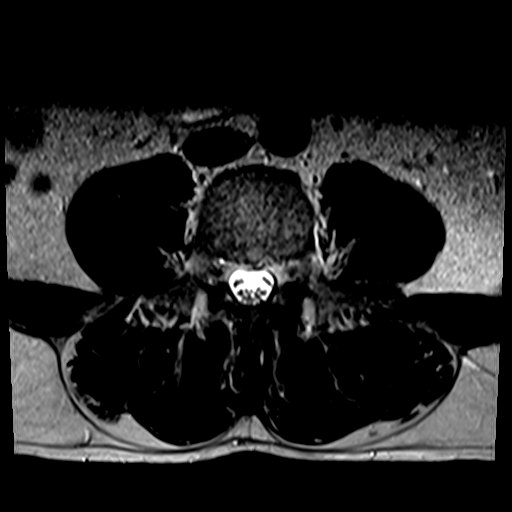
[im 20/40]
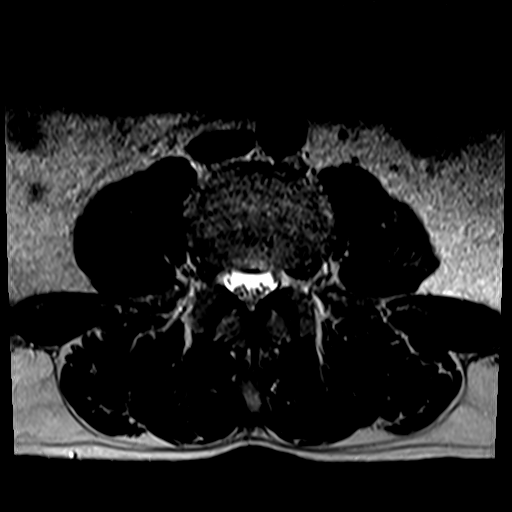
[im 23/40]
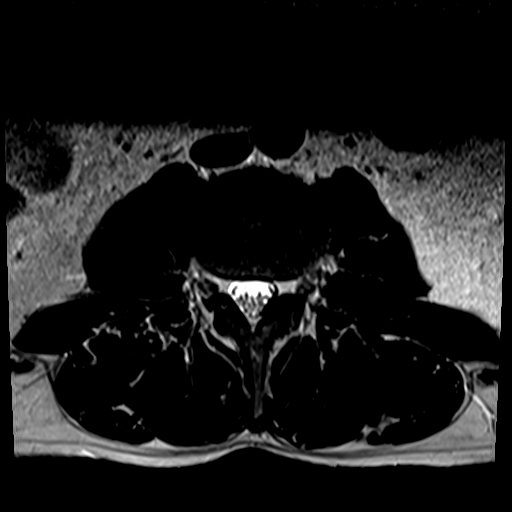
[im 28/40]
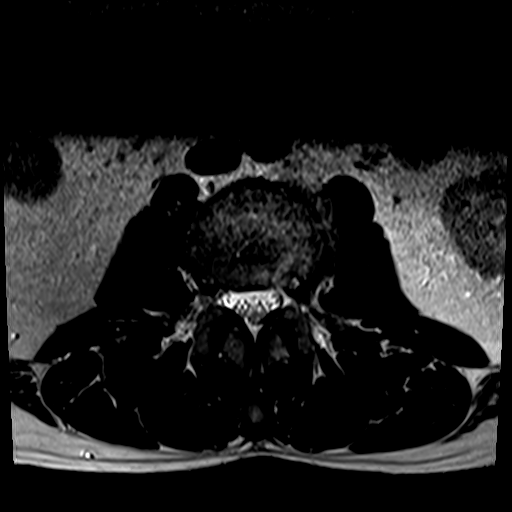
[im 34/40]
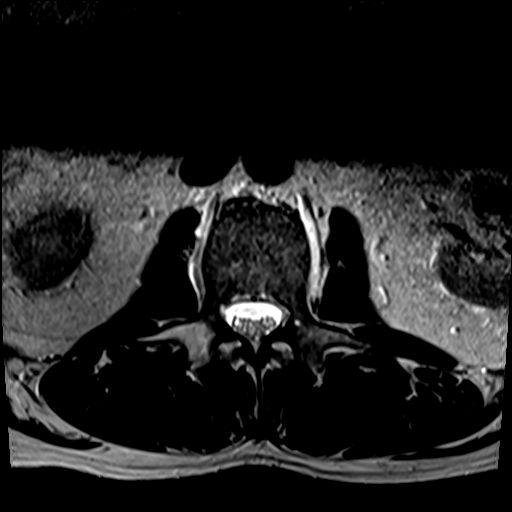
[im 40/40]
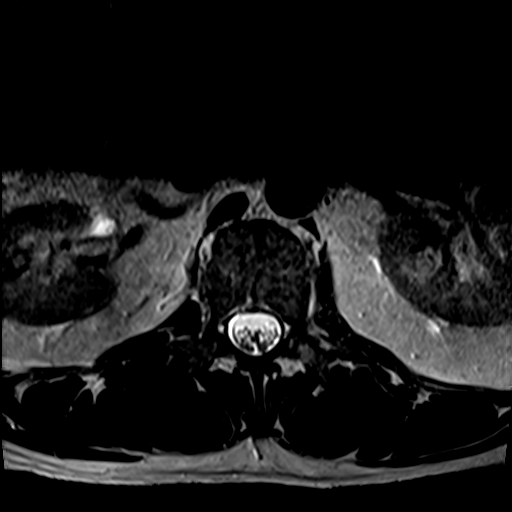

[Series 6: T1 · axial · 4.0mm · 0.39mm/px · z∈[-162,+3]mm · 5 of 40 slices shown (2 of 2)]
[im 1/40]
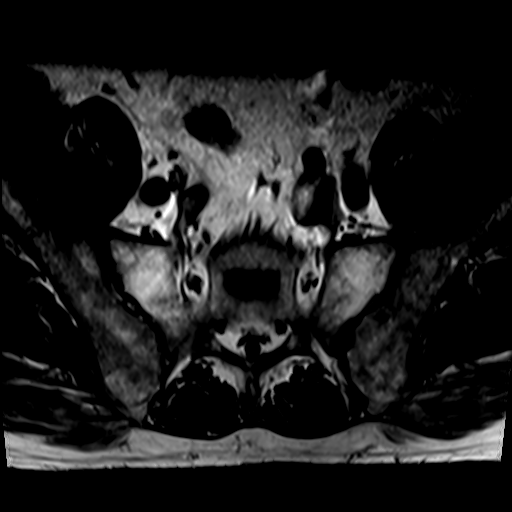
[im 6/40]
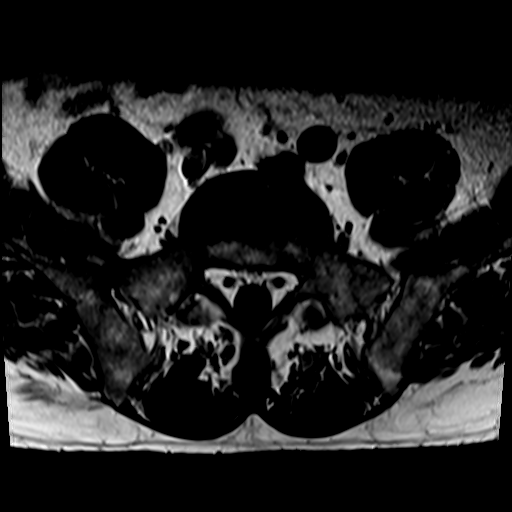
[im 12/40]
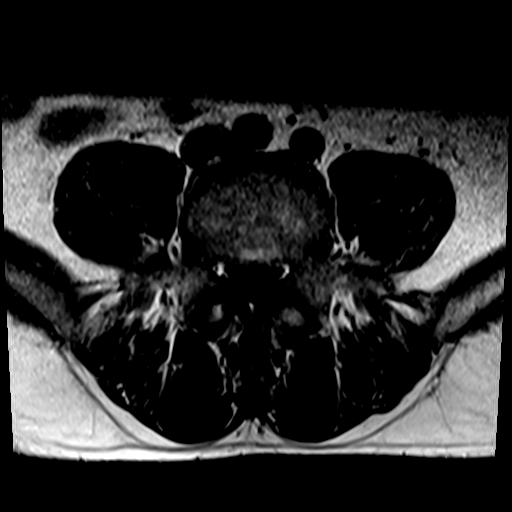
[im 20/40]
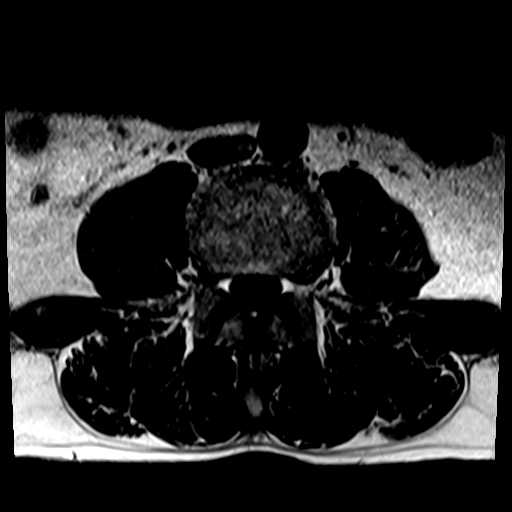
[im 34/40]
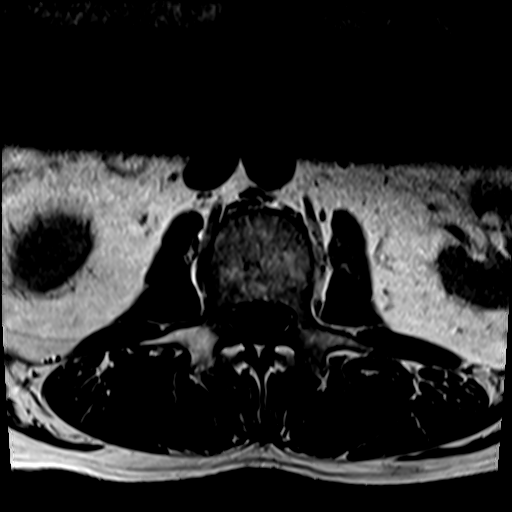

[26 of 48 positions shown; findings below may reference images not displayed]

FINDINGS: Segmentation:  Standard.

Alignment: Straightening of the normal cervical lordosis. Trace
retrolisthesis L5 on S1.

Vertebrae:  No fracture, evidence of discitis, or bone lesion.

Conus medullaris and cauda equina: Conus extends to the L1 level.
Conus and cauda equina appear normal.

Paraspinal and other soft tissues: Negative.

Disc levels:

T12-L1: Seen only on the sagittal images. No spinal canal stenosis
or neural foraminal narrowing.

L1-L2: No significant disc bulge. No spinal canal stenosis or neural
foraminal narrowing.

L2-L3: Mild disc bulge. Mild facet arthropathy. No spinal canal
stenosis or neural foraminal narrowing.

L3-L4: Mild disc bulge. Mild facet arthropathy. No spinal canal
stenosis or neural foraminal narrowing

L4-L5: Mild disc bulge with right foraminal annular fissure. Mild
facet arthropathy. No spinal canal stenosis. Mild right neural
foraminal narrowing.

L5-S1: Mild disc bulge. No spinal canal stenosis. Mild bilateral
neural foraminal narrowing.
IMPRESSION: 1. L5-S1 mild bilateral neural foraminal narrowing.
2. L4-L5 mild right neural foraminal narrowing, with a right
foraminal annular fissure.
3. No spinal canal stenosis.

## 2023-08-01 DIAGNOSIS — F902 Attention-deficit hyperactivity disorder, combined type: Secondary | ICD-10-CM | POA: Diagnosis not present

## 2023-10-24 DIAGNOSIS — F902 Attention-deficit hyperactivity disorder, combined type: Secondary | ICD-10-CM | POA: Diagnosis not present

## 2023-11-10 DIAGNOSIS — R7303 Prediabetes: Secondary | ICD-10-CM | POA: Insufficient documentation

## 2024-01-16 DIAGNOSIS — F902 Attention-deficit hyperactivity disorder, combined type: Secondary | ICD-10-CM | POA: Diagnosis not present

## 2024-02-13 ENCOUNTER — Ambulatory Visit: Admission: EM | Admit: 2024-02-13 | Discharge: 2024-02-13 | Disposition: A | Discharge status: Home / Self Care

## 2024-02-13 DIAGNOSIS — T24202A Burn of second degree of unspecified site of left lower limb, except ankle and foot, initial encounter: Secondary | ICD-10-CM

## 2024-02-13 DIAGNOSIS — Z23 Encounter for immunization: Secondary | ICD-10-CM | POA: Diagnosis not present

## 2024-02-13 MED ORDER — SILVER SULFADIAZINE 1 % EX CREA
TOPICAL_CREAM | Freq: Once | CUTANEOUS | Status: AC
  Administered 2024-02-13: 1 via TOPICAL

## 2024-02-13 MED ORDER — TETANUS-DIPHTH-ACELL PERTUSSIS 5-2.5-18.5 LF-MCG/0.5 IM SUSY
0.5000 mL | PREFILLED_SYRINGE | Freq: Once | INTRAMUSCULAR | Status: AC
  Administered 2024-02-13: 0.5 mL via INTRAMUSCULAR

## 2024-02-13 MED ORDER — SILVER SULFADIAZINE 1 % EX CREA: 1.0000 | TOPICAL_CREAM | Freq: Every day | CUTANEOUS | 0 refills | Status: AC
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
## 2024-02-14 ENCOUNTER — Telehealth: Payer: Self-pay | Admitting: Internal Medicine

## 2024-02-14 NOTE — Telephone Encounter (Signed)
 Entered in error

## 2024-03-05 DIAGNOSIS — M19011 Primary osteoarthritis, right shoulder: Secondary | ICD-10-CM | POA: Diagnosis not present

## 2024-03-05 DIAGNOSIS — M25511 Pain in right shoulder: Secondary | ICD-10-CM | POA: Diagnosis not present

## 2024-04-13 DIAGNOSIS — I10 Essential (primary) hypertension: Secondary | ICD-10-CM | POA: Diagnosis not present

## 2024-04-13 DIAGNOSIS — R7303 Prediabetes: Secondary | ICD-10-CM | POA: Diagnosis not present

## 2024-04-13 DIAGNOSIS — E785 Hyperlipidemia, unspecified: Secondary | ICD-10-CM | POA: Diagnosis not present

## 2024-07-10 ENCOUNTER — Encounter: Payer: Self-pay | Admitting: Pharmacist

## 2024-07-10 ENCOUNTER — Other Ambulatory Visit: Payer: Self-pay

## 2024-07-31 ENCOUNTER — Other Ambulatory Visit (HOSPITAL_COMMUNITY): Payer: Self-pay

## 2024-08-01 ENCOUNTER — Other Ambulatory Visit (HOSPITAL_COMMUNITY): Payer: Self-pay

## 2024-08-01 MED ORDER — ROSUVASTATIN CALCIUM 20 MG PO TABS
20.0000 mg | ORAL_TABLET | Freq: Every day | ORAL | 2 refills | Status: AC
  Filled 2024-08-01: qty 90, 90d supply, fill #0
